# Patient Record
Sex: Female | Born: 1990 | Hispanic: No | Marital: Single | Smoking: Never smoker
Health system: Southern US, Community
[De-identification: ages and names within clinical notes are randomized; demographics above are authoritative.]

## PROBLEM LIST (undated history)

## (undated) DIAGNOSIS — E669 Obesity, unspecified: Secondary | ICD-10-CM

## (undated) HISTORY — PX: APPENDECTOMY: SHX54

---

## 2018-02-02 ENCOUNTER — Ambulatory Visit (INDEPENDENT_AMBULATORY_CARE_PROVIDER_SITE_OTHER): Payer: BLUE CROSS/BLUE SHIELD

## 2018-02-02 ENCOUNTER — Ambulatory Visit
Admission: EM | Admit: 2018-02-02 | Discharge: 2018-02-02 | Disposition: A | Discharge status: Home / Self Care | Payer: BLUE CROSS/BLUE SHIELD | Attending: Family Medicine | Admitting: Family Medicine

## 2018-02-02 ENCOUNTER — Other Ambulatory Visit: Payer: Self-pay

## 2018-02-02 DIAGNOSIS — M25572 Pain in left ankle and joints of left foot: Secondary | ICD-10-CM

## 2018-02-02 DIAGNOSIS — S93402A Sprain of unspecified ligament of left ankle, initial encounter: Secondary | ICD-10-CM

## 2018-02-02 DIAGNOSIS — S93602A Unspecified sprain of left foot, initial encounter: Secondary | ICD-10-CM | POA: Diagnosis not present

## 2018-02-02 DIAGNOSIS — M7989 Other specified soft tissue disorders: Secondary | ICD-10-CM | POA: Diagnosis not present

## 2018-02-02 DIAGNOSIS — M25562 Pain in left knee: Secondary | ICD-10-CM

## 2018-02-02 HISTORY — DX: Obesity, unspecified: E66.9

## 2018-02-02 MED ORDER — HYDROCODONE-ACETAMINOPHEN 5-325 MG PO TABS: ORAL_TABLET | ORAL | 0 refills | Status: AC

## 2018-02-02 NOTE — ED Triage Notes (Addendum)
Pt fell in bathroom on Monday and injured her ankle then. Then she states she hit her same ankle later that day at work with a pallet. Pain to lateral aspect left ankle. 8/10

## 2018-02-02 NOTE — ED Provider Notes (Signed)
[facility]    CSN: [id number] Arrival date & time: 02/02/18  1346     History   Chief Complaint Chief Complaint  Patient presents with  . Ankle Pain    HPI Leslie Stafford is a 27 y.o. female.   27 yo female with a c/o left ankle, foot and knee pain and swelling after falling 5 days ago and then hitting her ankle again later in the day. Has been able to ambulate, however this is painful.   The history is provided by the patient.  Ankle Pain    Past Medical History:  Diagnosis Date  . Obese     There are no active problems to display for this patient.   Past Surgical History:  Procedure Laterality Date  . APPENDECTOMY      OB History   None      Home Medications    Prior to Admission medications   Medication Sig Start Date End Date Taking? Authorizing Provider  HYDROcodone-acetaminophen (NORCO/VICODIN) 5-325 MG tablet 1-2 tabs po bid prn 02/02/18   Payton Mccallum, MD    Family History History reviewed. No pertinent family history.  Social History Social History   Tobacco Use  . Smoking status: Never Smoker  . Smokeless tobacco: Never Used  Substance Use Topics  . Alcohol use: Not Currently    Frequency: Never  . Drug use: Not Currently     Allergies   Ibuprofen   Review of Systems Review of Systems   Physical Exam Triage Vital Signs ED Triage Vitals  Enc Vitals Group     BP 02/02/18 1414 (!) 146/84     Pulse Rate 02/02/18 1414 85     Resp 02/02/18 1414 18     Temp 02/02/18 1414 98.1 F (36.7 C)     Temp Source 02/02/18 1414 Oral     SpO2 02/02/18 1414 99 %     Weight 02/02/18 1415 (!) 320 lb (145.2 kg)     Height 02/02/18 1415  (1.651 m)     Head Circumference --      Peak Flow --      Pain Score 02/02/18 1412 8     Pain Loc --      Pain Edu? --      Excl. in GC? --    No data found.  Updated Vital Signs BP (!) 146/84 (BP Location: Left Arm)   Pulse 85   Temp 98.1 F (36.7 C) (Oral)   Resp 18   Ht 5'  5" (1.651 m)   Wt (!) 320 lb (145.2 kg)   LMP 02/02/2018   SpO2 99%   BMI 53.25 kg/m   Visual Acuity Right Eye Distance:   Left Eye Distance:   Bilateral Distance:    Right Eye Near:   Left Eye Near:    Bilateral Near:     Physical Exam  Constitutional: She appears well-developed and well-nourished. No distress.  Musculoskeletal:       Left knee: She exhibits bony tenderness. She exhibits normal range of motion, no swelling, no effusion, no ecchymosis, no deformity, no laceration, no erythema, normal alignment, no LCL laxity, normal patellar mobility, normal meniscus and no MCL laxity. Tenderness found. Lateral joint line tenderness noted.       Left ankle: She exhibits decreased range of motion and swelling. She exhibits no ecchymosis, no deformity, no laceration and normal pulse. Tenderness. Lateral malleolus, medial malleolus, AITFL and proximal fibula tenderness found. No head of  5th metatarsal tenderness found. Achilles tendon normal.  Skin: She is not diaphoretic.  Nursing note and vitals reviewed.    UC Treatments / Results  Labs (all labs ordered are listed, but only abnormal results are displayed) Labs Reviewed - No data to display  EKG None  Radiology Dg Ankle Complete Left  Result Date: 02/02/2018 CLINICAL DATA:  Lateral left ankle pain following a fall 5 days ago. EXAM: LEFT ANKLE COMPLETE - 3+ VIEW COMPARISON:  None. FINDINGS: Calcaneal spurs.  No fracture or dislocation.  No effusion. IMPRESSION: No fracture.  Calcaneal spurs. Electronically Signed   By: Beckie Salts M.D.   On: 02/02/2018 15:00   Dg Knee Complete 4 Views Left  Result Date: 02/02/2018 CLINICAL DATA:  Lateral left knee pain following a fall 5 days ago. EXAM: LEFT KNEE - COMPLETE 4+ VIEW COMPARISON:  None. FINDINGS: Minimal posterior patellar spur formation. No fracture, dislocation or effusion. IMPRESSION: No fracture.  Minimal patellofemoral degenerative spur formation. Electronically Signed   By:  Beckie Salts M.D.   On: 02/02/2018 15:01   Dg Foot Complete Left  Result Date: 02/02/2018 CLINICAL DATA:  Lateral left foot pain following a fall 5 days ago. EXAM: LEFT FOOT - COMPLETE 3+ VIEW COMPARISON:  Left ankle radiographs obtained at the same time. FINDINGS: Calcaneal spurs.  No fracture or dislocation. IMPRESSION: No fracture.  Calcaneal spurs. Electronically Signed   By: Beckie Salts M.D.   On: 02/02/2018 15:00    Procedures Procedures (including critical care time)  Medications Ordered in UC Medications - No data to display  Initial Impression / Assessment and Plan / UC Course  I have reviewed the triage vital signs and the nursing notes.  Pertinent labs & imaging results that were available during my care of the patient were reviewed by me and considered in my medical decision making (see chart for details).      Final Clinical Impressions(s) / UC Diagnoses   Final diagnoses:  Sprain of left ankle, unspecified ligament, initial encounter  Sprain of left foot, initial encounter     Discharge Instructions     Rest, ice, elevation, tylenol and/or advil as needed for pain    ED Prescriptions    Medication Sig Dispense Auth. Provider   HYDROcodone-acetaminophen (NORCO/VICODIN) 5-325 MG tablet 1-2 tabs po bid prn 6 tablet Oluwadarasimi Favor, Pamala Hurry, MD      1. x-ray results and diagnosis reviewed with patient 2. rx as per orders above; reviewed possible side effects, interactions, risks and benefits  3. Recommend supportive treatment with rest, ice, elevation, otc analgesics prn 4. Follow-up prn if symptoms worsen or don't improve  Controlled Substance Prescriptions [place] Controlled Substance Registry consulted? Not Applicable   Payton Mccallum, MD 02/02/18 1524

## 2018-02-02 NOTE — Discharge Instructions (Signed)
Rest, ice, elevation, tylenol and/or advil as needed for pain

## 2019-11-13 IMAGING — CR DG ANKLE COMPLETE 3+V*L*
3 series · 3 of 3 positions shown · non-contrast
Comparison: None.

CLINICAL DATA: Lateral left ankle pain following a fall 5 days ago.

EXAM:
LEFT ANKLE COMPLETE - 3+ VIEW

[ankle ap]
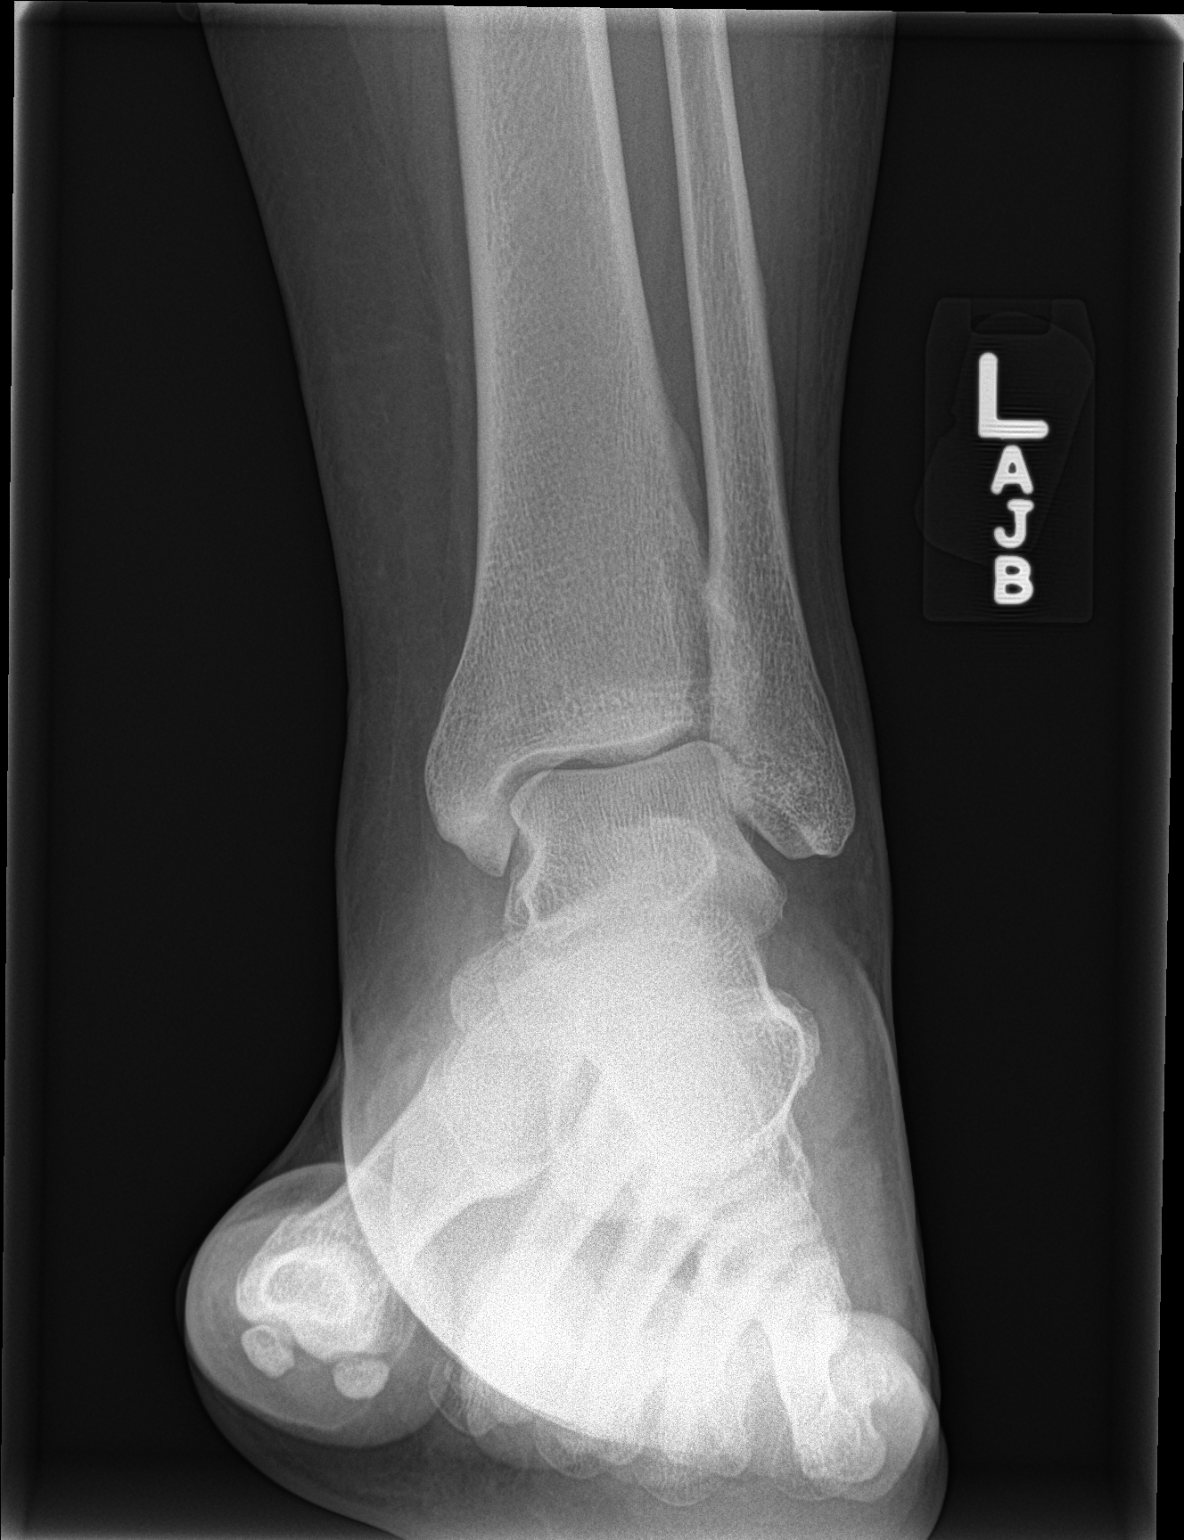

[ankle obl]
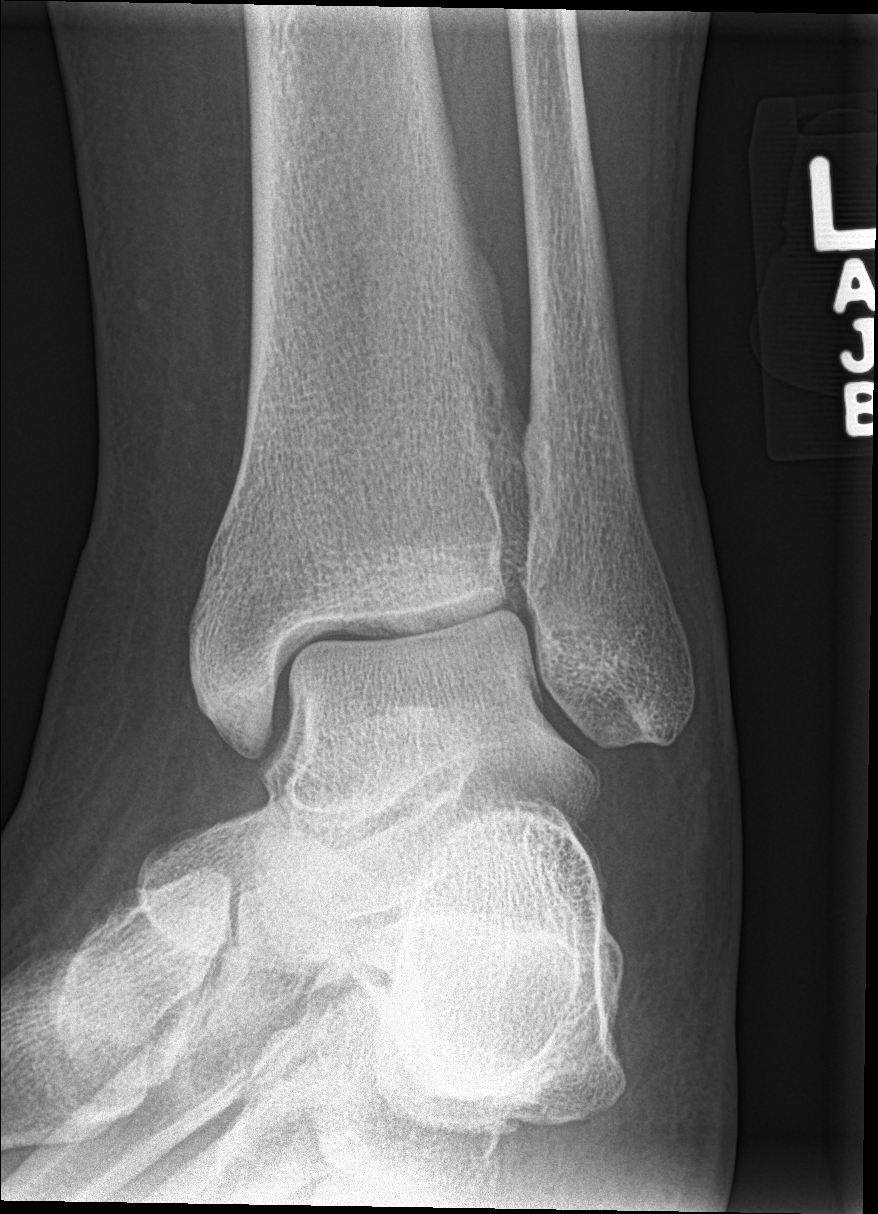

[ankle lat]
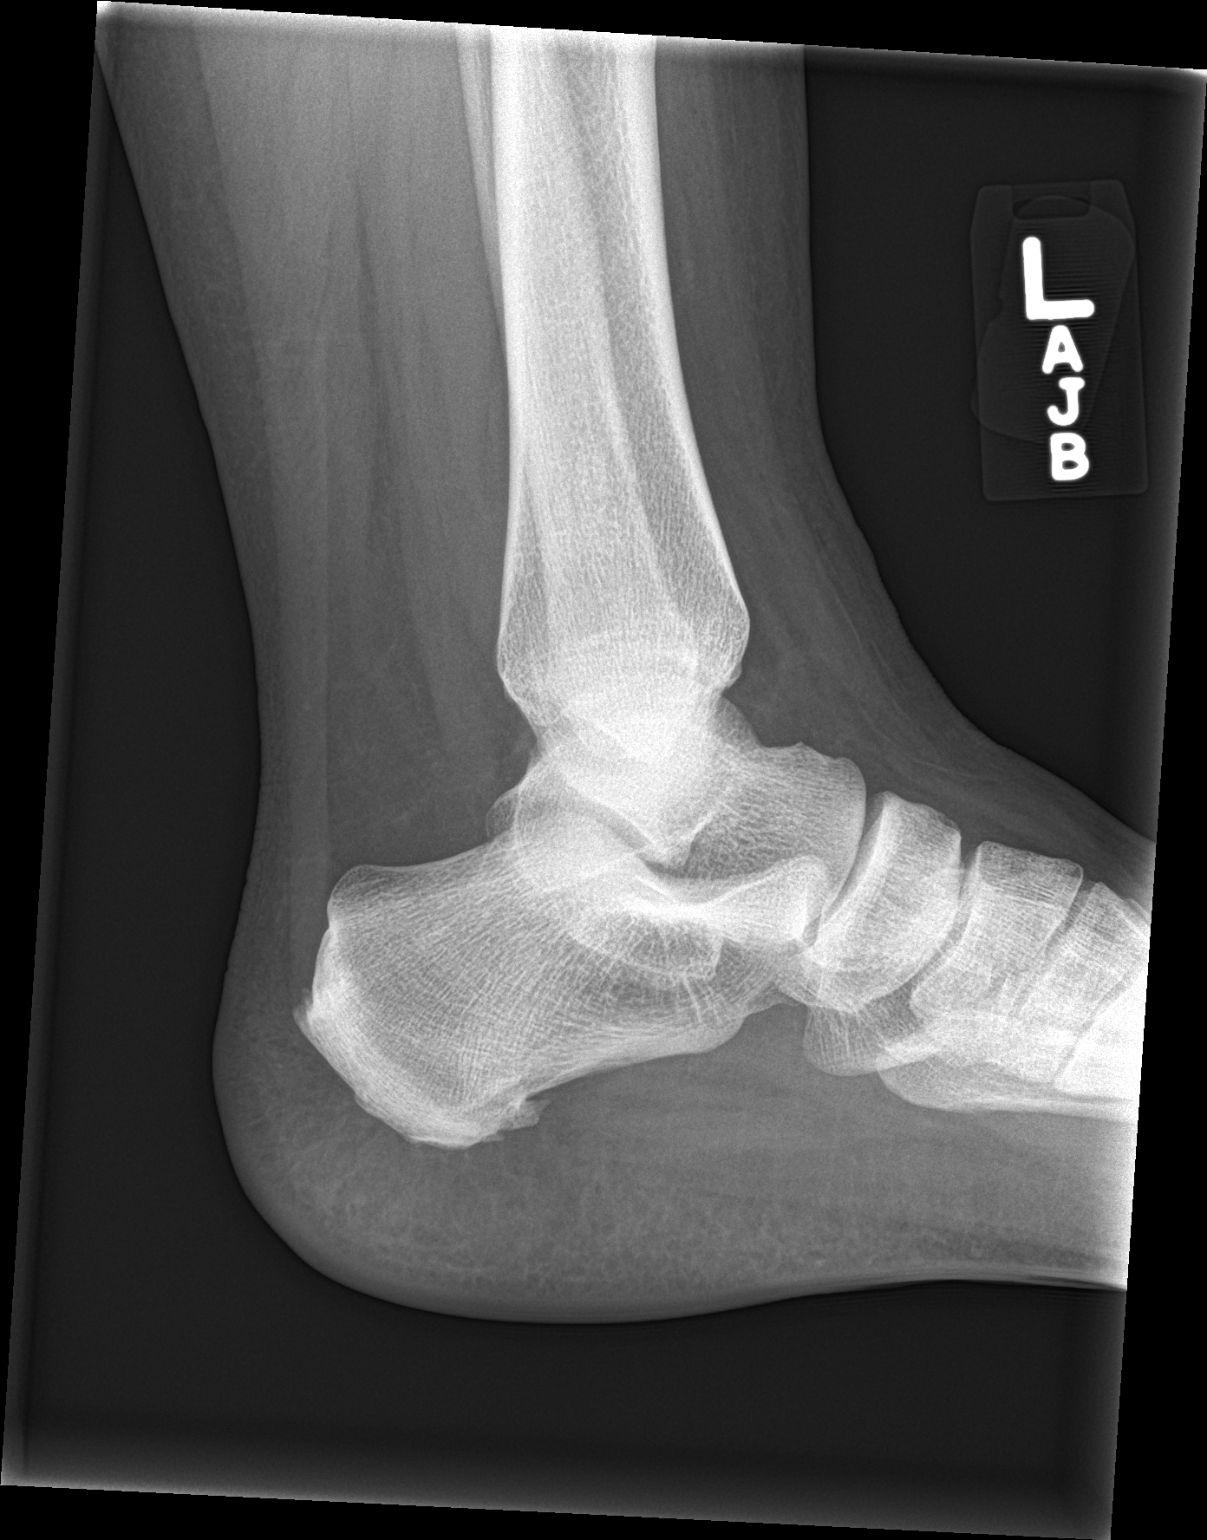

[3 of 3 positions shown; findings below may reference images not displayed]

FINDINGS: Calcaneal spurs.  No fracture or dislocation.  No effusion.
IMPRESSION: No fracture.  Calcaneal spurs.
